# Patient Record
Sex: Female | Born: 1950 | Smoking: Never smoker
Health system: Southern US, Community
[De-identification: ages and names within clinical notes are randomized; demographics above are authoritative.]

## PROBLEM LIST (undated history)

## (undated) DIAGNOSIS — F32A Depression, unspecified: Secondary | ICD-10-CM

## (undated) DIAGNOSIS — F329 Major depressive disorder, single episode, unspecified: Secondary | ICD-10-CM

## (undated) DIAGNOSIS — E7800 Pure hypercholesterolemia, unspecified: Secondary | ICD-10-CM

## (undated) DIAGNOSIS — F419 Anxiety disorder, unspecified: Secondary | ICD-10-CM

## (undated) HISTORY — PX: TONSILLECTOMY: SUR1361

## (undated) HISTORY — PX: TUBAL LIGATION: SHX77

## (undated) HISTORY — PX: ROTATOR CUFF REPAIR: SHX139

---

## 2016-02-08 ENCOUNTER — Emergency Department (HOSPITAL_COMMUNITY)
Admission: EM | Admit: 2016-02-08 | Discharge: 2016-02-08 | Disposition: A | Discharge status: Home / Self Care | Payer: Medicaid - Out of State | Attending: Dermatology | Admitting: Dermatology

## 2016-02-08 ENCOUNTER — Encounter (HOSPITAL_COMMUNITY): Payer: Self-pay | Admitting: Emergency Medicine

## 2016-02-08 DIAGNOSIS — Z5321 Procedure and treatment not carried out due to patient leaving prior to being seen by health care provider: Secondary | ICD-10-CM | POA: Insufficient documentation

## 2016-02-08 DIAGNOSIS — Y939 Activity, unspecified: Secondary | ICD-10-CM | POA: Insufficient documentation

## 2016-02-08 DIAGNOSIS — M542 Cervicalgia: Secondary | ICD-10-CM | POA: Diagnosis not present

## 2016-02-08 DIAGNOSIS — Y999 Unspecified external cause status: Secondary | ICD-10-CM | POA: Insufficient documentation

## 2016-02-08 DIAGNOSIS — M25512 Pain in left shoulder: Secondary | ICD-10-CM | POA: Insufficient documentation

## 2016-02-08 DIAGNOSIS — M25562 Pain in left knee: Secondary | ICD-10-CM | POA: Insufficient documentation

## 2016-02-08 DIAGNOSIS — M545 Low back pain: Secondary | ICD-10-CM | POA: Diagnosis not present

## 2016-02-08 DIAGNOSIS — Y92410 Unspecified street and highway as the place of occurrence of the external cause: Secondary | ICD-10-CM | POA: Insufficient documentation

## 2016-02-08 DIAGNOSIS — R51 Headache: Secondary | ICD-10-CM | POA: Diagnosis not present

## 2016-02-08 HISTORY — DX: Anxiety disorder, unspecified: F41.9

## 2016-02-08 HISTORY — DX: Pure hypercholesterolemia, unspecified: E78.00

## 2016-02-08 HISTORY — DX: Major depressive disorder, single episode, unspecified: F32.9

## 2016-02-08 HISTORY — DX: Depression, unspecified: F32.A
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
## 2016-02-08 NOTE — ED Notes (Signed)
Patient called 3X with no response 

## 2016-02-09 ENCOUNTER — Emergency Department (HOSPITAL_COMMUNITY)
Admission: EM | Admit: 2016-02-09 | Discharge: 2016-02-09 | Disposition: A | Discharge status: Home / Self Care | Payer: Medicaid - Out of State | Attending: Emergency Medicine | Admitting: Emergency Medicine

## 2016-02-09 ENCOUNTER — Emergency Department (HOSPITAL_COMMUNITY): Payer: Medicaid - Out of State

## 2016-02-09 ENCOUNTER — Encounter (HOSPITAL_COMMUNITY): Payer: Self-pay

## 2016-02-09 DIAGNOSIS — Y999 Unspecified external cause status: Secondary | ICD-10-CM | POA: Diagnosis not present

## 2016-02-09 DIAGNOSIS — Y92410 Unspecified street and highway as the place of occurrence of the external cause: Secondary | ICD-10-CM | POA: Insufficient documentation

## 2016-02-09 DIAGNOSIS — Y939 Activity, unspecified: Secondary | ICD-10-CM | POA: Insufficient documentation

## 2016-02-09 DIAGNOSIS — M25512 Pain in left shoulder: Secondary | ICD-10-CM | POA: Diagnosis not present

## 2016-02-09 DIAGNOSIS — F329 Major depressive disorder, single episode, unspecified: Secondary | ICD-10-CM | POA: Insufficient documentation

## 2016-02-09 DIAGNOSIS — M25562 Pain in left knee: Secondary | ICD-10-CM | POA: Diagnosis present

## 2016-02-09 DIAGNOSIS — R51 Headache: Secondary | ICD-10-CM | POA: Diagnosis not present

## 2016-02-09 DIAGNOSIS — R410 Disorientation, unspecified: Secondary | ICD-10-CM | POA: Diagnosis not present

## 2016-02-09 DIAGNOSIS — R519 Headache, unspecified: Secondary | ICD-10-CM

## 2016-02-09 DIAGNOSIS — E7800 Pure hypercholesterolemia, unspecified: Secondary | ICD-10-CM | POA: Diagnosis not present

## 2016-02-09 MED ORDER — METHOCARBAMOL 500 MG PO TABS: 500.0000 mg | ORAL_TABLET | Freq: Two times a day (BID) | ORAL | Status: AC

## 2016-02-09 MED ORDER — HYDROCODONE-ACETAMINOPHEN 5-325 MG PO TABS
1.0000 | ORAL_TABLET | Freq: Once | ORAL | Status: AC
  Administered 2016-02-09: 1 via ORAL
  Filled 2016-02-09: qty 1

## 2016-02-09 MED ORDER — NAPROXEN 500 MG PO TABS: 500.0000 mg | ORAL_TABLET | Freq: Two times a day (BID) | ORAL | Status: AC
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
## 2016-02-09 NOTE — Discharge Instructions (Signed)
Cryotherapy °Cryotherapy is when you put ice on your injury. Ice helps lessen pain and puffiness (swelling) after an injury. Ice works the best when you start using it in the first 24 to 48 hours after an injury. °HOME CARE °· Put a dry or damp towel between the ice pack and your skin. °· You may press gently on the ice pack. °· Leave the ice on for no more than 10 to 20 minutes at a time. °· Check your skin after 5 minutes to make sure your skin is okay. °· Rest at least 20 minutes between ice pack uses. °· Stop using ice when your skin loses feeling (numbness). °· Do not use ice on someone who cannot tell you when it hurts. This includes small children and people with memory problems (dementia). °GET HELP RIGHT AWAY IF: °· You have white spots on your skin. °· Your skin turns blue or pale. °· Your skin feels waxy or hard. °· Your puffiness gets worse. °MAKE SURE YOU:  °· Understand these instructions. °· Will watch your condition. °· Will get help right away if you are not doing well or get worse. °  °This information is not intended to replace advice given to you by your health care provider. Make sure you discuss any questions you have with your health care provider. °  °Document Released: 02/01/2008 Document Revised: 11/07/2011 Document Reviewed: 04/07/2011 °[employer]vier Interactive Patient Education ©2016 [employer] ° °Joint Pain °Joint pain, which is also called arthralgia, can be caused by many things. Joint pain often goes away when you follow your health care provider's instructions for relieving pain at home. However, joint pain can also be caused by conditions that require further treatment. Common causes of joint pain include: °· Bruising in the area of the joint. °· Overuse of the joint. °· Wear and tear on the joints that occur with aging (osteoarthritis). °· Various other forms of arthritis. °· A buildup of a crystal form of uric acid in the joint (gout). °· Infections of the joint (septic arthritis)  or of the bone (osteomyelitis). °Your health care provider may recommend medicine to help with the pain. If your joint pain continues, additional tests may be needed to diagnose your condition. °HOME CARE INSTRUCTIONS °Watch your condition for any changes. Follow these instructions as directed to lessen the pain that you are feeling. °· Take medicines only as directed by your health care provider. °· Rest the affected area for as long as your health care provider says that you should. If directed to do so, raise the painful joint above the level of your heart while you are sitting or lying down. °· Do not do things that cause or worsen pain. °· If directed, apply ice to the painful area: °¨ Put ice in a plastic bag. °¨ Place a towel between your skin and the bag. °¨ Leave the ice on for 20 minutes, 2-3 times per day. °· Wear an elastic bandage, splint, or sling as directed by your health care provider. Loosen the elastic bandage or splint if your fingers or toes become numb and tingle, or if they turn cold and blue. °· Begin exercising or stretching the affected area as directed by your health care provider. Ask your health care provider what types of exercise are safe for you. °· Keep all follow-up visits as directed by your health care provider. This is important. °SEEK MEDICAL CARE IF: °· Your pain increases, and medicine does not help. °· Your joint   pain does not improve within 3 days.  You have increased bruising or swelling.  You have a fever.  You lose 10 lb (4.5 kg) or more without trying. SEEK IMMEDIATE MEDICAL CARE IF:  You are not able to move the joint.  Your fingers or toes become numb or they turn cold and blue.   This information is not intended to replace advice given to you by your health care provider. Make sure you discuss any questions you have with your health care provider.  Follow up with your primary care provider if your symptoms do not improve. Apply ice to affected area.  Take Naprosyn and Robaxin as needed for pain. Return to the ED if you experience severe worsening of your symptoms, loss of consciousness, vomiting, blurry vision, numbness/tingling in both of your lower extremities.
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
